# Patient Record
Sex: Male | Born: 1995 | Race: Black or African American | Hispanic: No | Marital: Single | Smoking: Never smoker
Health system: Southern US, Community
[De-identification: ages and names within clinical notes are randomized; demographics above are authoritative.]

---
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
## 2015-05-16 ENCOUNTER — Encounter (HOSPITAL_COMMUNITY): Payer: Self-pay | Admitting: Emergency Medicine

## 2015-05-16 ENCOUNTER — Emergency Department (HOSPITAL_COMMUNITY): Payer: Medicaid Other

## 2015-05-16 ENCOUNTER — Emergency Department (HOSPITAL_COMMUNITY)
Admission: EM | Admit: 2015-05-16 | Discharge: 2015-05-16 | Disposition: A | Discharge status: Home / Self Care | Payer: Medicaid Other | Attending: Emergency Medicine | Admitting: Emergency Medicine

## 2015-05-16 DIAGNOSIS — S0011XA Contusion of right eyelid and periocular area, initial encounter: Secondary | ICD-10-CM | POA: Insufficient documentation

## 2015-05-16 DIAGNOSIS — S022XXA Fracture of nasal bones, initial encounter for closed fracture: Secondary | ICD-10-CM

## 2015-05-16 DIAGNOSIS — Y92310 Basketball court as the place of occurrence of the external cause: Secondary | ICD-10-CM | POA: Insufficient documentation

## 2015-05-16 DIAGNOSIS — Y998 Other external cause status: Secondary | ICD-10-CM | POA: Diagnosis not present

## 2015-05-16 DIAGNOSIS — S02401A Maxillary fracture, unspecified, initial encounter for closed fracture: Secondary | ICD-10-CM

## 2015-05-16 DIAGNOSIS — W03XXXA Other fall on same level due to collision with another person, initial encounter: Secondary | ICD-10-CM | POA: Insufficient documentation

## 2015-05-16 DIAGNOSIS — Y9367 Activity, basketball: Secondary | ICD-10-CM | POA: Diagnosis not present

## 2015-05-16 DIAGNOSIS — S0993XA Unspecified injury of face, initial encounter: Secondary | ICD-10-CM | POA: Diagnosis present

## 2015-05-16 MED ORDER — AMOXICILLIN 500 MG PO CAPS: 500.0000 mg | ORAL_CAPSULE | Freq: Three times a day (TID) | ORAL | Status: AC

## 2015-05-16 NOTE — Discharge Instructions (Signed)
Nasal Fracture °A fracture is a break in a bone. A nasal fracture is a broken nose. Minor breaks do not need treatment. Serious breaks may need surgery. °HOME CARE °· If directed, put ice on the injured area: °¨ Put ice in a plastic bag. °¨ Place a towel between your skin and the bag. °¨ Leave the ice on for 20 minutes, 2-3 times per day. °· Take over-the-counter and prescription medicines only as told by your doctor. °· If your nose bleeds, sit up while you gently squeeze your nose shut for 10 minutes. °· Try to not blow your nose. °· Return to your normal activities as told by your doctor. Ask your doctor what activities are safe for you. °· Do not play contact sports for 3-4 weeks or as told by your doctor. °· Keep all follow-up visits as told by your doctor. This is important. °GET HELP IF: °· You have more pain or very bad pain. °· You keep having nosebleeds. °· The shape of your nose does not return to normal after 5 days. °· You have pus coming out of your nose. °GET HELP RIGHT AWAY IF: °· Your nose bleeds for more than 20 minutes. °· You have clear fluid draining out of your nose. °· You have a grape-like swelling on the inside of your nose. °· You have trouble moving your eyes. °· You keep throwing up (vomiting). °  °This information is not intended to replace advice given to you by your health care provider. Make sure you discuss any questions you have with your health care provider. °  °Document Released: 03/16/2008 Document Revised: 02/26/2015 Document Reviewed: 07/15/2014 °Elsevier Interactive Patient Education ©2016 [employer] ° °

## 2015-05-16 NOTE — ED Notes (Signed)
Pt states that on Wed he was playing basketball with his brother and they collided and now his nose is very swollen with bruising under right eye.

## 2015-05-19 NOTE — ED Provider Notes (Signed)
CSN: [id number]     Arrival date & time 05/16/15  1213 History   First MD Initiated Contact with Patient 05/16/15 1347     Chief Complaint  Patient presents with  . Facial Injury     (Consider location/radiation/quality/duration/timing/severity/associated sxs/prior Treatment) HPI   Jerry Garrett is a 19 y.o. male who presents to the Emergency Department complaining of pain and swelling of the nose.  Patient states that he was playing basketball and collided with his brother striking his nose.  Reports bleeding from both nostrils and immediate swelling and deformity of his nose.  Incident occurred several days ago.  He complains of continued pain and swelling with bruising under the right eye.  He has not taken any medications for symptom relief.  He denies vomiting, headaches, visual changes, continued nose bleeds or difficulty swallowing.     History reviewed. No pertinent past medical history. History reviewed. No pertinent past surgical history. History reviewed. No pertinent family history. Social History  Substance Use Topics  . Smoking status: Never Smoker   . Smokeless tobacco: None  . Alcohol Use: No    Review of Systems  Constitutional: Negative for fever, activity change and appetite change.  HENT: Positive for congestion, facial swelling and nosebleeds (swelling  and tenderness of the nose). Negative for ear pain and trouble swallowing.   Respiratory: Negative for shortness of breath.   Cardiovascular: Negative for chest pain.  Gastrointestinal: Negative for nausea and vomiting.  Neurological: Negative for dizziness, syncope, facial asymmetry, weakness and headaches.  Psychiatric/Behavioral: Negative for confusion.      Allergies  Review of patient's allergies indicates no known allergies.  Home Medications   Prior to Admission medications   Medication Sig Start Date End Date Taking? Authorizing Provider  amoxicillin (AMOXIL) 500 MG capsule Take 1 capsule  (500 mg total) by mouth 3 (three) times daily. For 10 days 05/16/15   Mandrell Vangilder, PA-C   BP 132/87 mmHg  Pulse 65  Temp(Src) 98.2 F (36.8 C) (Oral)  Resp 16  Ht 5\' 11"  (1.803 m)  Wt 68.04 kg  BMI 20.93 kg/m2  SpO2 100% Physical Exam  Constitutional: He is oriented to person, place, and time. He appears well-developed and well-nourished. No distress.  HENT:  Right Ear: Tympanic membrane and ear canal normal. No hemotympanum.  Left Ear: Tympanic membrane and ear canal normal. No hemotympanum.  Nose: Mucosal edema and nasal deformity present. No nasal septal hematoma. No epistaxis.  Mouth/Throat: Uvula is midline, oropharynx is clear and moist and mucous membranes are normal.  Dried blood to the right nostril.  ttp of the nose, moderate edema and ecchymosis just below the right eye.  No zygomatic tenderness  Eyes: Conjunctivae and EOM are normal. Pupils are equal, round, and reactive to light.  Neck: Normal range of motion. Neck supple.  Cardiovascular: Normal rate, regular rhythm and intact distal pulses.   Pulmonary/Chest: Effort normal and breath sounds normal. No respiratory distress.  Musculoskeletal: Normal range of motion.  Neurological: He is alert and oriented to person, place, and time. He exhibits normal muscle tone. Coordination normal.  Skin: Skin is warm and dry.  Nursing note and vitals reviewed.   ED Course  Procedures (including critical care time) Labs Review Labs Reviewed - No data to display  Imaging Review Dg Nasal Bones  05/16/2015  CLINICAL DATA:  Injury.  Initial evaluation. EXAM: NASAL BONES - 3+ VIEW COMPARISON:  CT 10/01/2012. FINDINGS: Comminuted scratched nasal fractures are present. Maxillary spine  intact. Paranasal sinuses are clear. IMPRESSION: Comminuted nasal fractures. Electronically Signed   By: Maisie Fus  Register   On: 05/16/2015 13:04   Ct Head Wo Contrast  05/16/2015  CLINICAL DATA:  Collision while playing basketball 2 days ago. Pain  and swelling below the right eye. Initial encounter. EXAM: CT HEAD WITHOUT CONTRAST CT MAXILLOFACIAL WITHOUT CONTRAST TECHNIQUE: Multidetector CT imaging of the head and maxillofacial structures were performed using the standard protocol without intravenous contrast. Multiplanar CT image reconstructions of the maxillofacial structures were also generated. COMPARISON:  Nasal bone radiographs from the same day. FINDINGS: CT HEAD FINDINGS No acute cortical infarct, hemorrhage, or mass lesion is present. The ventricles are of normal size. No significant extra-axial fluid collection is evident. The paranasal sinuses and mastoid air cells are clear. A right nasal fracture is only partially imaged. The calvarium is otherwise intact. Soft tissue swelling is noted about the nose. No other significant extracranial soft tissue injury is evident. CT MAXILLOFACIAL FINDINGS A comminuted right nasal bone fracture is present. There is a fracture in the anterior process of the right maxilla as well. The maxillary sinuses otherwise intact. The zygomatic arch is intact. The paranasal sinuses and mastoid air cells are clear. The mandible is intact and located. IMPRESSION: 1. Comminuted right set nasal bone fractures. 2. Normal CT appearance the brain. 3. No other significant facial fractures. Electronically Signed   By: Marin Roberts M.D.   On: 05/16/2015 15:25   Ct Maxillofacial Wo Cm  05/16/2015  CLINICAL DATA:  Collision while playing basketball 2 days ago. Pain and swelling below the right eye. Initial encounter. EXAM: CT HEAD WITHOUT CONTRAST CT MAXILLOFACIAL WITHOUT CONTRAST TECHNIQUE: Multidetector CT imaging of the head and maxillofacial structures were performed using the standard protocol without intravenous contrast. Multiplanar CT image reconstructions of the maxillofacial structures were also generated. COMPARISON:  Nasal bone radiographs from the same day. FINDINGS: CT HEAD FINDINGS No acute cortical infarct,  hemorrhage, or mass lesion is present. The ventricles are of normal size. No significant extra-axial fluid collection is evident. The paranasal sinuses and mastoid air cells are clear. A right nasal fracture is only partially imaged. The calvarium is otherwise intact. Soft tissue swelling is noted about the nose. No other significant extracranial soft tissue injury is evident. CT MAXILLOFACIAL FINDINGS A comminuted right nasal bone fracture is present. There is a fracture in the anterior process of the right maxilla as well. The maxillary sinuses otherwise intact. The zygomatic arch is intact. The paranasal sinuses and mastoid air cells are clear. The mandible is intact and located. IMPRESSION: 1. Comminuted right set nasal bone fractures. 2. Normal CT appearance the brain. 3. No other significant facial fractures. Electronically Signed   By: Marin Roberts M.D.   On: 05/16/2015 15:25    I have personally reviewed and evaluated these images and lab results as part of my medical decision-making.    MDM   Final diagnoses:  Nasal bone fracture, closed, initial encounter  Fracture of maxilla, closed, initial encounter   Pt is well appearing.  airwaay patent.  No active epistaxis.  Results discussed with pt and his mother.  He was given nasal fx precautions and agrees to close ENT f/u.  rx for amoxil    Pauline Aus, PA-C 05/19/15 2104  Bethann Berkshire, MD 05/27/15 2316

## 2016-09-16 IMAGING — CT CT HEAD W/O CM
3 of 5 series · 14 of 47 positions shown, 17 images · non-contrast
Comparison: Nasal bone radiographs from the same day.

CLINICAL DATA: Collision while playing basketball 2 days ago. Pain
and swelling below the right eye. Initial encounter.

EXAM:
CT HEAD WITHOUT CONTRAST
CT MAXILLOFACIAL WITHOUT CONTRAST
TECHNIQUE: Multidetector CT imaging of the head and maxillofacial structures
were performed using the standard protocol without intravenous
contrast. Multiplanar CT image reconstructions of the maxillofacial
structures were also generated.

[Series 4: max soft 2.0 h31s · axial · 0.37mm/px · z∈[-133,+32]mm · 9 of 134 slices shown, 12 images]
[im 12/134  brain]
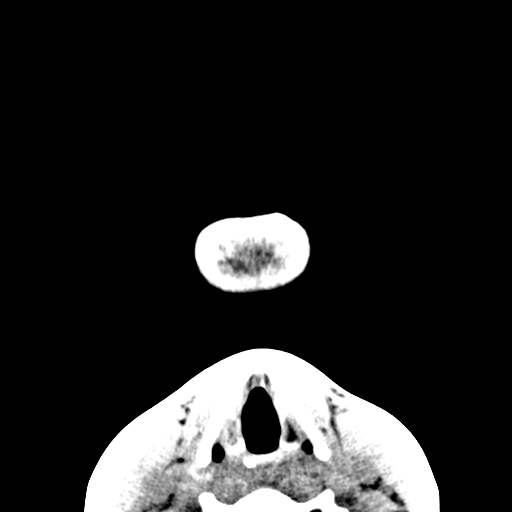
[im 12/134  bone]
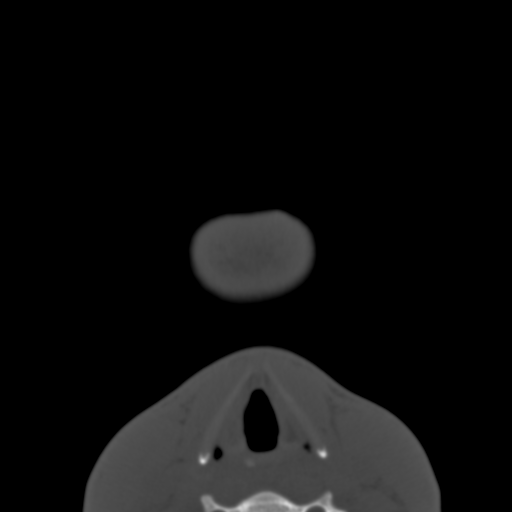
[im 24/134  brain]
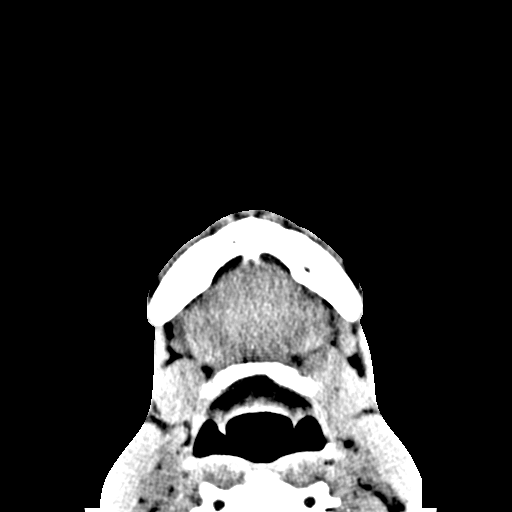
[im 41/134  brain]
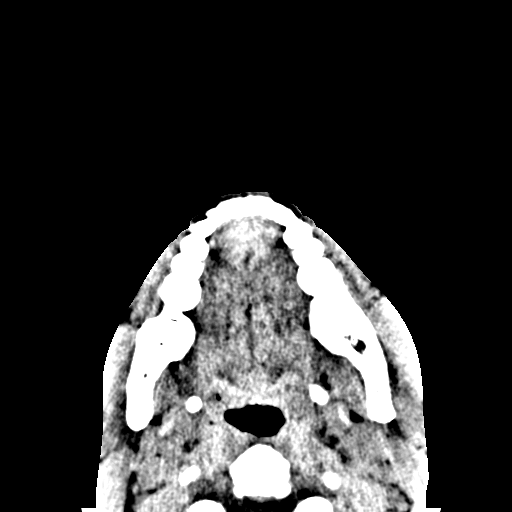
[im 53/134  brain]
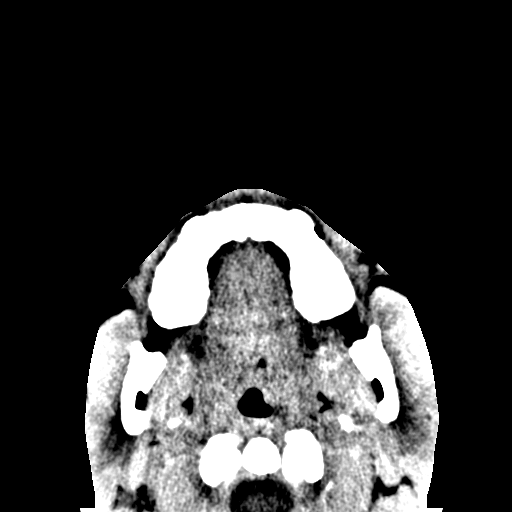
[im 70/134  brain]
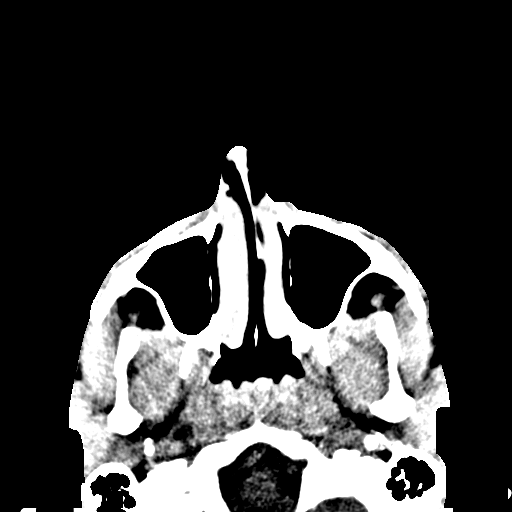
[im 70/134  bone]
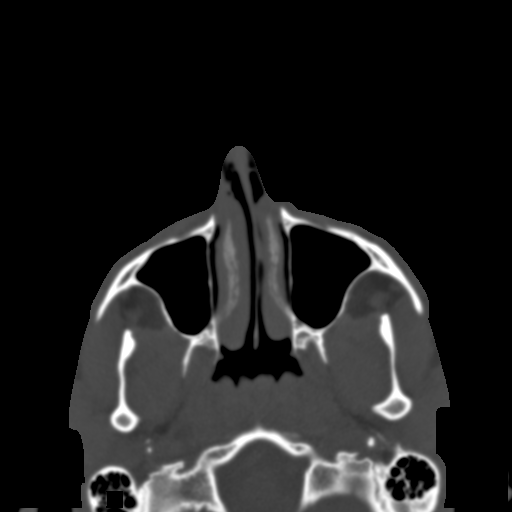
[im 81/134  brain]
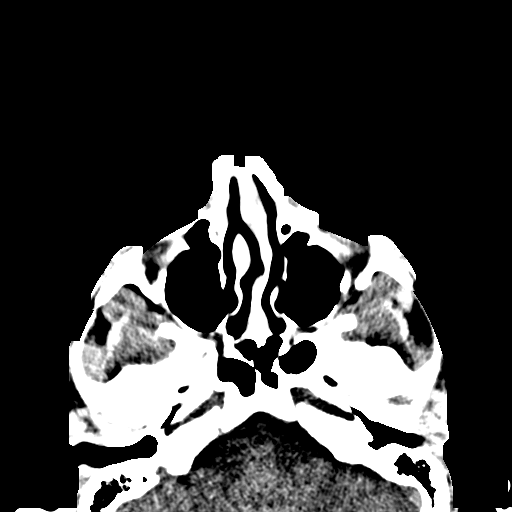
[im 93/134  brain]
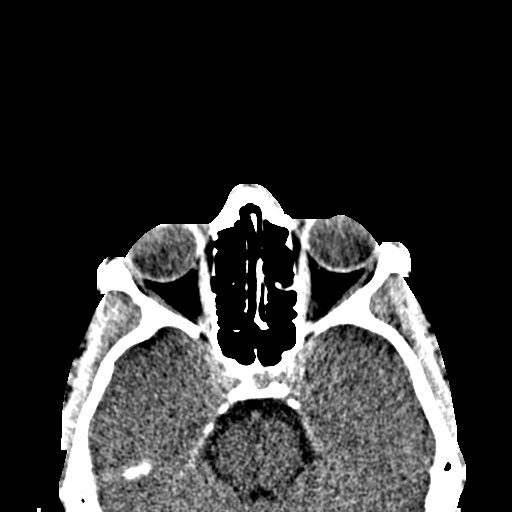
[im 110/134  brain]
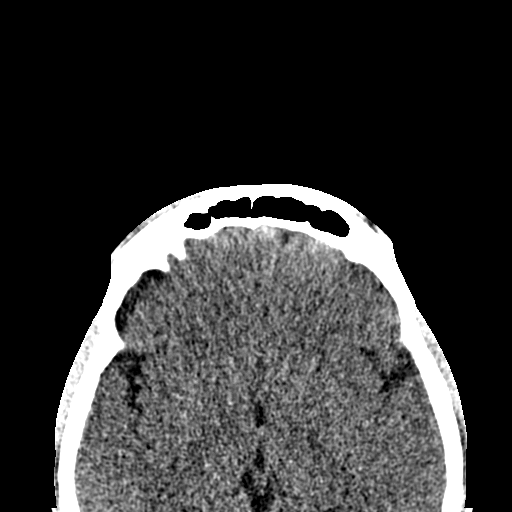
[im 122/134  brain]
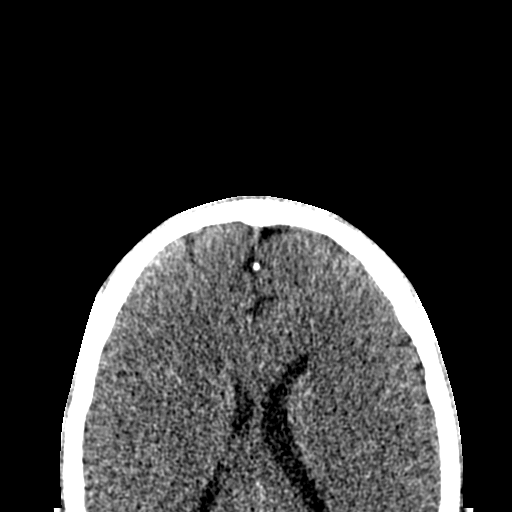
[im 122/134  bone]
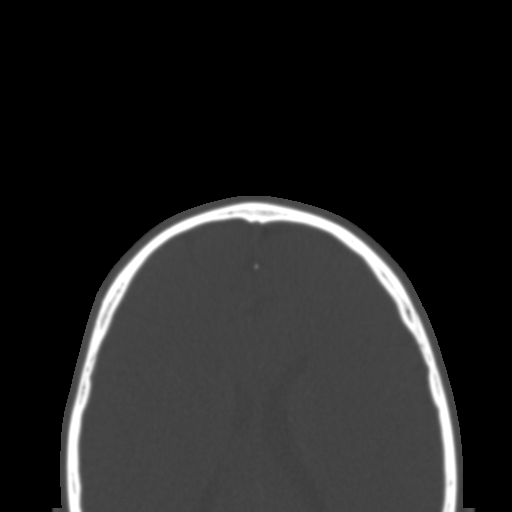

[Series 6: max st coronal · coronal · 0.30mm/px · 2 of 68 slices shown]
[im 23/68  brain]
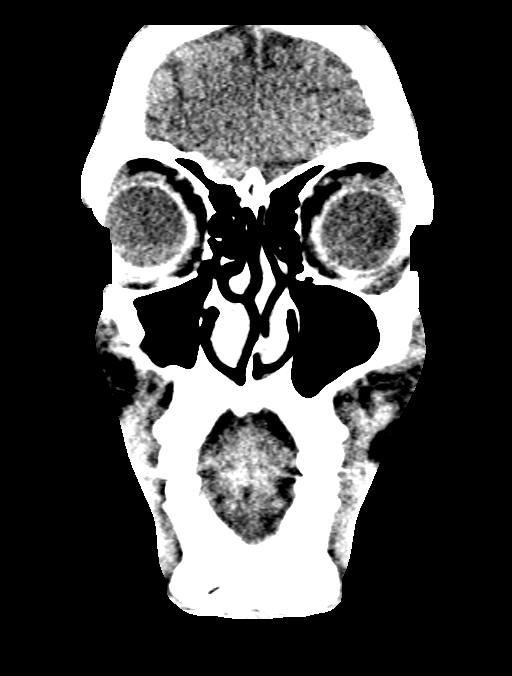
[im 45/68  brain]
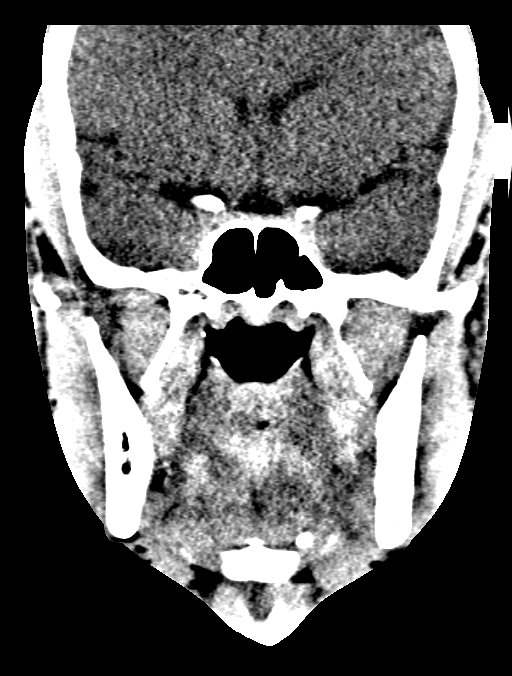

[Series 7: max st sagittal · sagittal · 0.30mm/px · 3 of 83 slices shown]
[im 28/83  brain]
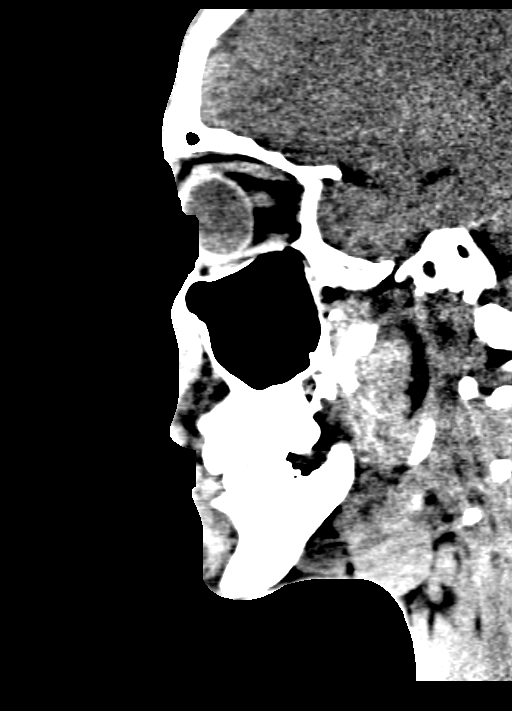
[im 42/83  brain]
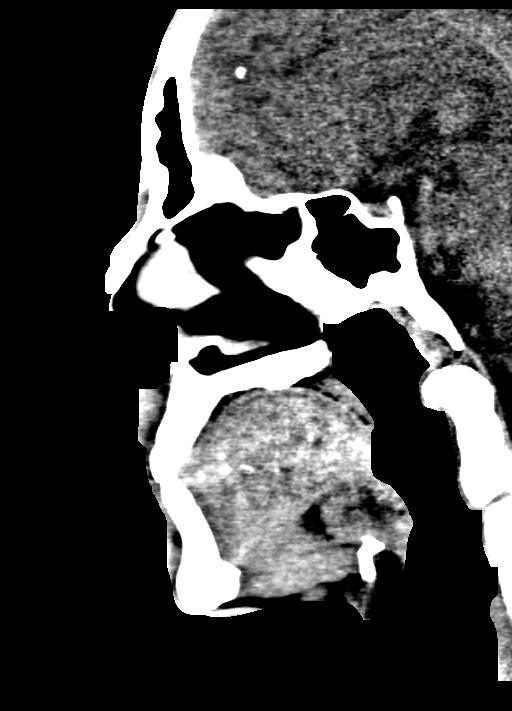
[im 55/83  brain]
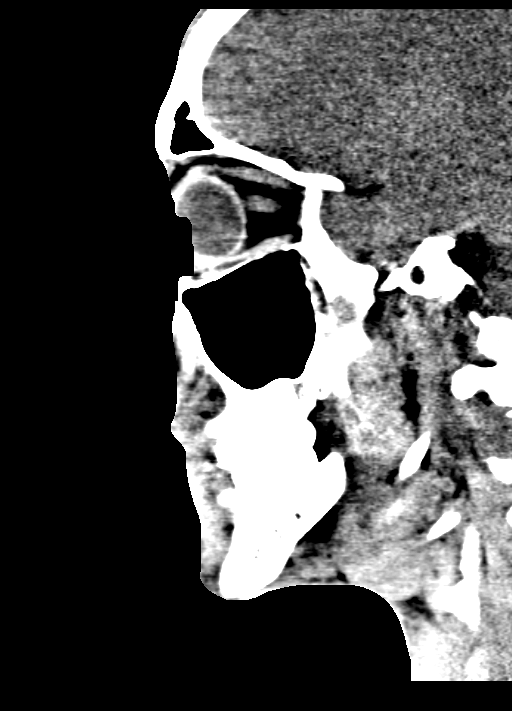

[14 of 47 positions shown; findings below may reference images not displayed]

FINDINGS: CT HEAD FINDINGS

No acute cortical infarct, hemorrhage, or mass lesion is present.
The ventricles are of normal size. No significant extra-axial fluid
collection is evident. The paranasal sinuses and mastoid air cells
are clear. A right nasal fracture is only partially imaged. The
calvarium is otherwise intact. Soft tissue swelling is noted about
the nose. No other significant extracranial soft tissue injury is
evident.

CT MAXILLOFACIAL FINDINGS

A comminuted right nasal bone fracture is present. There is a
fracture in the anterior process of the right maxilla as well. The
maxillary sinuses otherwise intact. The zygomatic arch is intact.
The paranasal sinuses and mastoid air cells are clear. The mandible
is intact and located.
IMPRESSION: 1. Comminuted right set nasal bone fractures.
2. Normal CT appearance the brain.
3. No other significant facial fractures.

## 2019-03-22 ENCOUNTER — Other Ambulatory Visit: Payer: Self-pay

## 2019-03-22 ENCOUNTER — Encounter (HOSPITAL_COMMUNITY): Payer: Self-pay | Admitting: Physical Therapy

## 2019-03-22 ENCOUNTER — Ambulatory Visit (HOSPITAL_COMMUNITY): Payer: 59 | Attending: Orthopedic Surgery | Admitting: Physical Therapy

## 2019-03-22 DIAGNOSIS — R262 Difficulty in walking, not elsewhere classified: Secondary | ICD-10-CM | POA: Insufficient documentation

## 2019-03-22 DIAGNOSIS — M25572 Pain in left ankle and joints of left foot: Secondary | ICD-10-CM | POA: Diagnosis not present

## 2019-03-22 NOTE — Patient Instructions (Signed)
Access Code: [email or web address]  URL: [email or web address]  Date: 03/22/2019  Prepared by: Yetta Glassman   Exercises Seated Calf Stretch with Strap - 3 reps - 1 sets - 30 hold - 1x daily - 7x weekly Toe Spreading - 10 reps - 15-20 hold - 5x daily - 7x weekly Seated Toe Flexion Extension PROM - 10 reps - 15-20 hold - 5x daily - 7x weekly Seated Toe Curl - 10 reps - 15-20 hold - 5x daily - 7x weekly Seated Self Great Toe Stretch - 10 reps - 15-20 hold - 5x daily - 7x weekly
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
## 2019-03-28 ENCOUNTER — Other Ambulatory Visit: Payer: Self-pay

## 2019-03-28 ENCOUNTER — Ambulatory Visit (HOSPITAL_COMMUNITY): Payer: 59 | Admitting: Physical Therapy

## 2019-03-28 ENCOUNTER — Encounter (HOSPITAL_COMMUNITY): Payer: Self-pay | Admitting: Physical Therapy

## 2019-03-28 DIAGNOSIS — M25572 Pain in left ankle and joints of left foot: Secondary | ICD-10-CM | POA: Diagnosis not present

## 2019-03-28 DIAGNOSIS — R262 Difficulty in walking, not elsewhere classified: Secondary | ICD-10-CM

## 2019-03-28 NOTE — Therapy (Signed)
[facility] [address] [place], [place], [address] Phone: [phone]   Fax:  [phone]  Physical Therapy Treatment  Patient Details  Name: Jerry Garrett MRN: [id number] Date of Birth: 1996-05-14 Referring Provider (PT): Arturo Morton   Encounter Date: 03/28/2019  PT End of Session - 03/28/19 1615    Visit Number  2    Number of Visits  4    Date for PT Re-Evaluation  04/12/19    Authorization Type  [employer] and [employer]    Authorization Time Period  03/22/19 to 04/12/19    Authorization - Visit Number  2    Authorization - Number of Visits  4    PT Start Time  1615    PT Stop Time  1700    PT Time Calculation (min)  45 min    Activity Tolerance  Patient tolerated treatment well;No increased pain    Behavior During Therapy  [facility] for tasks assessed/performed       History reviewed. No pertinent past medical history.  History reviewed. No pertinent surgical history.  There were no vitals filed for this visit.  Subjective Assessment - 03/28/19 1615    Subjective  no pain. slight difficulty with stretching exercises and would like to see quicker progress but understands it's going to be slow.    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  no difficulties    How long can you stand comfortably?  in his post op shoe - no difficulties.    How long can you walk comfortably?  in his post op shoe - no difficulties.    Patient Stated Goals  to have more mobility    Pain Onset  1 to 4 weeks ago         [facility] PT Assessment - 03/28/19 0001      Assessment   Medical Diagnosis  s/p left cheilectomy, synovectomy 1st metatarsophalangeal joint     Referring Provider (PT)  Jamelle Haring Daws    Onset Date/Surgical Date  02/28/19    Next MD Visit  3 weeks    Prior Therapy  no                   [facility] Adult PT Treatment/Exercise - 03/28/19  0001      Manual Therapy   Manual Therapy  Joint mobilization    Manual therapy comments  all manual therapy performed independently of other treatment interventions    Joint Mobilization  Hallux joint mobilitzation - distal and proximall joint grade III for mobility in both directions. metatarsal mobilization (1-3) grade III for mobility. TCJ grade III with PROM into DF. - tolerated wel      Ankle Exercises: Seated   Towel Crunch  Other (comment)   2x10 5" holds --> with inversion and eversion - 2x10 each              PT Short Term Goals - 03/28/19 1714      PT SHORT TERM GOAL #1   Title  Patient will be independent in HEP to improve functional outcomes.    Time  4    Period  Weeks    Status  On-going    Target Date  04/19/19      PT SHORT TERM GOAL #2   Title  Patient will demonstrate at least 30 degrees of left  first MTP into extension to demonstrate improved toe mobility.    Baseline  eval - 15 degrees    Time  4    Period  Weeks    Status  New    Target Date  04/19/19      PT SHORT TERM GOAL #3   Title  Patient will demonstrate at least 45 degrees of left first MTP into flexion to demonstrate improved toe mobility.    Baseline  eval 30 degrees    Time  4    Period  Weeks    Status  New    Target Date  04/19/19               Plan - 03/28/19 1709    Clinical Impression Statement  Focused on joint mobility. Improved distal hallux mobility noted. No pain during session. Educated patient on continued mobility work at home. Added towel scrunch exercise to HEP. Patietn would continue to benefit from skilled physical therapy focusing on improving post operative functional mobility.    Personal Factors and Comorbidities  Comorbidity 1    Comorbidities  hx of left foot bone fractures (unknown specifiic bones), chronic toe stiffness, low back pain    Examination-Activity Limitations  Squat;Stairs;Lift;Stand    Examination-Participation Restrictions  Yard  Work;Cleaning    Stability/Clinical Decision Making  Stable/Uncomplicated    Rehab Potential  Excellent    PT Frequency  1x / week    PT Duration  4 weeks    PT Treatment/Interventions  ADLs/Self Care Home Management;Aquatic Therapy;Biofeedback;Cryotherapy;Electrical Stimulation;Iontophoresis 4mg /ml Dexamethasone;Moist Heat;Traction;Gait training;Stair training;Functional mobility training;Therapeutic activities;Therapeutic exercise;Balance training;Neuromuscular re-education;Patient/family education;Manual techniques;Scar mobilization;Passive range of motion;Dry needling;Taping    PT Next Visit Plan  continued toe and foot mobility, ankle stretches, AROM and PROM; FOTO    PT Home Exercise Plan  eval: great toe mobility/stretches, calf stretch; 10/7 towel scrunch    Consulted and Agree with Plan of Care  Patient       Patient will benefit from skilled therapeutic intervention in order to improve the following deficits and impairments:  Pain, Decreased mobility, Decreased strength, Increased edema, Decreased balance, Hypomobility, Difficulty walking, Decreased range of motion  Visit Diagnosis: Difficulty in walking, not elsewhere classified  Pain in left ankle and joints of left foot     Problem List There are no active problems to display for this patient.  5:17 PM, 03/28/19 Jerene Pitch, DPT Physical Therapy with [facility]  [phone] office  [place] [address] [place], [place], [address] Phone: [phone]   Fax:  [phone]  Name: Jerry Garrett MRN: [id number] Date of Birth: [birth year]

## 2019-03-29 ENCOUNTER — Ambulatory Visit (HOSPITAL_COMMUNITY): Payer: 59 | Admitting: Physical Therapy

## 2019-04-05 ENCOUNTER — Other Ambulatory Visit: Payer: Self-pay

## 2019-04-05 ENCOUNTER — Ambulatory Visit (HOSPITAL_COMMUNITY): Payer: 59 | Admitting: Physical Therapy

## 2019-04-05 DIAGNOSIS — M25572 Pain in left ankle and joints of left foot: Secondary | ICD-10-CM

## 2019-04-05 DIAGNOSIS — R262 Difficulty in walking, not elsewhere classified: Secondary | ICD-10-CM

## 2019-04-05 NOTE — Therapy (Signed)
[place], [place], [address] Phone: [phone]   Fax:  [phone]  Physical Therapy Treatment  Patient Details  Name: Jerry Garrett MRN: [id number] Date of Birth: July 19, 1995 Referring Provider (PT): Catha Nottingham   Encounter Date: 04/05/2019  PT End of Session - 04/05/19 2376    Visit Number  3    Number of Visits  4    Date for PT Re-Evaluation  04/12/19    Authorization Type  [employer] and [employer]BS    Authorization Time Period  03/22/19 to 04/12/19    Authorization - Visit Number  3    Authorization - Number of Visits  4    PT Start Time  2831    PT Stop Time  1400    PT Time Calculation (min)  43 min    Activity Tolerance  Patient tolerated treatment well;No increased pain    Behavior During Therapy  [facility] for tasks assessed/performed       No past medical history on file.  No past surgical history on file.  There were no vitals filed for this visit.  Subjective Assessment - 04/05/19 1346    Subjective  states he is frustrated. He wishes he had more mobility. States that he has been doing his exercises and some of them are getting easier. No pain.    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  no difficulties    How long can you stand comfortably?  in his post op shoe - no difficulties.    How long can you walk comfortably?  in his post op shoe - no difficulties.    Patient Stated Goals  to have more mobility    Pain Score  0-No pain    Pain Onset  1 to 4 weeks ago         [facility] PT Assessment - 04/05/19 0001      Assessment   Medical Diagnosis  s/p left cheilectomy, synovectomy 1st metatarsophalangeal joint     Referring Provider (PT)  Emogene Morgan Daws    Onset Date/Surgical Date  02/28/19    Next MD Visit  3 weeks    Prior Therapy  no                   [facility] Adult PT Treatment/Exercise -  04/05/19 0001      Manual Therapy   Manual Therapy  Joint mobilization;Soft tissue mobilization    Manual therapy comments  all manual therapy performed independently of other treatment interventions    Joint Mobilization  Hallux joint mobilitzation - distal and proximall joint grade III for mobility in both directions. metatarsal mobilization (1-3) grade III for mobility. TCJ grade III with PROM into DF. - tolerated wel    Soft tissue mobilization  STM to Left interossei muscles and extensor hallucis longus      Ankle Exercises: Seated   Heel Raises  Left;15 reps;5 seconds   with overpressure at knee - eccentric lower   Heel Slides  Left;10 reps   10 SECOND HOLDS, focus on great toe stretch.              PT Short Term Goals - 03/28/19 1714      PT SHORT TERM GOAL #1   Title  Patient will be independent in HEP to improve functional outcomes.  Time  4    Period  Weeks    Status  On-going    Target Date  04/19/19      PT SHORT TERM GOAL #2   Title  Patient will demonstrate at least 30 degrees of left first MTP into extension to demonstrate improved toe mobility.    Baseline  eval - 15 degrees    Time  4    Period  Weeks    Status  New    Target Date  04/19/19      PT SHORT TERM GOAL #3   Title  Patient will demonstrate at least 45 degrees of left first MTP into flexion to demonstrate improved toe mobility.    Baseline  eval 30 degrees    Time  4    Period  Weeks    Status  New    Target Date  04/19/19               Plan - 04/05/19 1347    Clinical Impression Statement  tolerated session well. Continued stiffness noted in great toe. Focused on this and improvement noted in flexion and extension. Educated patietn in anticipated timeline with mobility due to chronicity of injury. Will continue to work on mobility as tolerated. Patient to follow up with MD next week.    Personal Factors and Comorbidities  Comorbidity 1    Comorbidities  hx of left foot bone  fractures (unknown specifiic bones), chronic toe stiffness, low back pain    Examination-Activity Limitations  Squat;Stairs;Lift;Stand    Examination-Participation Restrictions  Yard Work;Cleaning    Stability/Clinical Decision Making  Stable/Uncomplicated    Rehab Potential  Excellent    PT Frequency  1x / week    PT Duration  4 weeks    PT Treatment/Interventions  ADLs/Self Care Home Management;Aquatic Therapy;Biofeedback;Cryotherapy;Electrical Stimulation;Iontophoresis 4mg /ml Dexamethasone;Moist Heat;Traction;Gait training;Stair training;Functional mobility training;Therapeutic activities;Therapeutic exercise;Balance training;Neuromuscular re-education;Patient/family education;Manual techniques;Scar mobilization;Passive range of motion;Dry needling;Taping    PT Next Visit Plan  continued toe and foot mobility, ankle stretches, AROM and PROM; FOTO    PT Home Exercise Plan  eval: great toe mobility/stretches, calf stretch; 10/7 towel scrunch; 10/15 heel raises with over pressure    Consulted and Agree with Plan of Care  Patient       Patient will benefit from skilled therapeutic intervention in order to improve the following deficits and impairments:  Pain, Decreased mobility, Decreased strength, Increased edema, Decreased balance, Hypomobility, Difficulty walking, Decreased range of motion  Visit Diagnosis: Difficulty in walking, not elsewhere classified  Pain in left ankle and joints of left foot     Problem List There are no active problems to display for this patient.  2:54 PM, 04/05/19 04/07/19, DPT Physical Therapy with [facility]  [phone] office  [facility] [address] [place], [place], [place] Phone: [phone]   Fax:  [phone]  Name: Jerry Garrett MRN: Sharla Kidney Date of Birth: [birth year]

## 2019-04-12 ENCOUNTER — Ambulatory Visit (HOSPITAL_COMMUNITY): Payer: 59 | Admitting: Physical Therapy

## 2019-04-12 ENCOUNTER — Other Ambulatory Visit: Payer: Self-pay

## 2019-04-12 ENCOUNTER — Encounter (HOSPITAL_COMMUNITY): Payer: Self-pay | Admitting: Physical Therapy

## 2019-04-12 DIAGNOSIS — M25572 Pain in left ankle and joints of left foot: Secondary | ICD-10-CM

## 2019-04-12 DIAGNOSIS — R262 Difficulty in walking, not elsewhere classified: Secondary | ICD-10-CM

## 2019-04-12 NOTE — Patient Instructions (Signed)

## 2019-04-12 NOTE — Therapy (Signed)
[facility] [address] [place], [place], [address] Phone: [phone]   Fax:  [phone]  Physical Therapy Treatment and Recert/Progress Note  Patient Details  Name: Jerry Garrett MRN: [id number] Date of Birth: [birth year] Referring Provider (PT): Jamelle Haring Daws  Progress Note Reporting Period 03/22/19  to 04/12/19  See note below for Objective Data and Assessment of Progress/Goals.        Encounter Date: 04/12/2019  PT End of Session - 04/12/19 1509    Visit Number  4    Number of Visits  10    Authorization Type  Aetna and [employer]BS    Authorization Time Period  03/22/19 to 04/12/19 NEW POC DATES 04/12/19 to 05/24/19    Authorization - Visit Number  4    Authorization - Number of Visits  4    PT Start Time  1402    PT Stop Time  1445    PT Time Calculation (min)  43 min    Activity Tolerance  Patient tolerated treatment well;No increased pain    Behavior During Therapy  [facility] for tasks assessed/performed       History reviewed. No pertinent past medical history.  History reviewed. No pertinent surgical history.  There were no vitals filed for this visit.  Subjective Assessment - 04/12/19 1506    Subjective  states he missed his appointment with Dr. Lelon Mast. His mother was suppose to watch the kids for him and she had a kidney stone and couldn't watch them. 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  no difficulties    How long can you stand comfortably?  in his post op shoe - no difficulties.    How long can you walk comfortably?  in his post op shoe - no difficulties.    Patient Stated Goals  to have more mobility    Currently in Pain?  No/denies    Pain Onset  1 to 4 weeks ago         [facility] PT Assessment - 04/12/19 0001      Assessment   Medical Diagnosis  s/p left cheilectomy,  synovectomy 1st metatarsophalangeal joint     Referring Provider (PT)  Jamelle Haring Daws    Onset Date/Surgical Date  02/28/19    Next MD Visit  3 weeks    Prior Therapy  no      ROM / Strength   AROM / PROM / Strength  AROM;PROM      PROM   Overall PROM Comments  great hallux: flexion L 30, R 60; extension L 15, R 60    Right Ankle Dorsiflexion  5    Right Ankle Plantar Flexion  55    Left Ankle Dorsiflexion  5    Left Ankle Plantar Flexion  60                   [facility] Adult PT Treatment/Exercise - 04/12/19 0001      Manual Therapy   Manual Therapy  Joint mobilization;Soft tissue mobilization;Manual Traction    Manual therapy comments  all manual therapy performed independently of other treatment interventions    Joint Mobilization  Hallux joint mobilitzation - distal and proximall joint grade III for mobility in both directions. metatarsal mobilization (1-3) grade III for mobility. TCJ grade III with PROM into  DF.    Soft tissue mobilization  STM to interossei and plantar fascia on left - tolerated moderately well    Manual Traction  to left hallux, tolerated well - continuous      Ankle Exercises: Stretches   Other Stretch  PT overpressur stretch of left hallux into flexion and extension x15, 10 " holds    Other Stretch  seated hallux stretch in seating - 4x20 holds L       Trigger Point Dry Needling - 04/12/19 0001    Consent Given?  Yes    Education Handout Provided  Yes    Muscles Treated Lower Quadrant  --   left 1st interossei of foot and flexor hallucis longus L   Dry Needling Comments  improved mobility into great toe extension noted afterwards, 4 53mm needles at .25 width used - kept in situ 10 minutes with occassional twisting.            PT Education - 04/12/19 1507    Education Details  educated patient on risks of dry needling and contraindications of dry needling. Answered all questions about intervention    Person(s) Educated  Patient    Methods   Explanation;Handout    Comprehension  Verbalized understanding       PT Short Term Goals - 04/12/19 1542      PT SHORT TERM GOAL #1   Title  Patient will be independent in HEP to improve functional outcomes.    Time  4    Period  Weeks    Status  Achieved    Target Date  04/19/19      PT SHORT TERM GOAL #2   Title  Patient will demonstrate at least 30 degrees of left first MTP into extension to demonstrate improved toe mobility.    Baseline  eval - 15 degrees    Time  4    Period  Weeks    Status  On-going    Target Date  04/19/19      PT SHORT TERM GOAL #3   Title  Patient will demonstrate at least 45 degrees of left first MTP into flexion to demonstrate improved toe mobility.    Baseline  10/22 30 degrees    Time  4    Period  Weeks    Status  On-going    Target Date  04/19/19        PT Long Term Goals - 04/12/19 1542      PT LONG TERM GOAL #1   Title  Patient will be able to ambulate at least 226 feet with minimal gait deviations in regular shoe (per MD protocol) to demonstrate improved ambulation.    Baseline  currently not able - per MD protocol    Time  10    Period  Weeks    Status  New    Target Date  05/24/19      PT LONG TERM GOAL #2   Title  Patient will be able to stand for at least 60 minutes in work shoes without pain in his left foot to improve ability to stand for work.    Baseline  currently not able - per MD protocol    Time  6    Period  Weeks    Status  New    Target Date  05/24/19      PT LONG TERM GOAL #3   Title  Patient will be able to perform 20 SL heel raises on both  legs to demonstrate improved calf strength.    Baseline  currently not able - per MD protocol    Time  6    Period  Weeks    Status  New    Target Date  05/24/19      PT LONG TERM GOAL #4   Title  Patient will be able to stand on left leg for at least 30 seconds on blue foam to improve static balance.    Baseline  currently not able - per MD protocol             Plan - 04/12/19 1510    Clinical Impression Statement  Patient still in heel weight bearing shoe today as he missed his appointment this week. Scheduled to see MD next week prior to next therapy appointment. Anticipate MD to discontinue use of heel weight bearing shoe so long as everything is healing well. Extending POC additional 6 weeks to continue to work on joint mobility and transition to weight bearing activities in regular shoes (as permitted by MD). Continued stiffness in left hallux joint, added dry needling today and this was tolerated well. Improved mobility noted after dry needling. Patient would continue to benefit from skilled physical therapy focusing on improving functional mobility.    Personal Factors and Comorbidities  Comorbidity 1    Comorbidities  hx of left foot bone fractures (unknown specifiic bones), chronic toe stiffness, low back pain    Examination-Activity Limitations  Squat;Stairs;Lift;Stand    Examination-Participation Restrictions  Yard Work;Cleaning    Stability/Clinical Decision Making  Stable/Uncomplicated    Rehab Potential  Excellent    PT Frequency  1x / week    PT Duration  6 weeks    PT Treatment/Interventions  ADLs/Self Care Home Management;Aquatic Therapy;Biofeedback;Cryotherapy;Electrical Stimulation;Iontophoresis 4mg /ml Dexamethasone;Moist Heat;Traction;Gait training;Stair training;Functional mobility training;Therapeutic activities;Therapeutic exercise;Balance training;Neuromuscular re-education;Patient/family education;Manual techniques;Scar mobilization;Passive range of motion;Dry needling;Taping    PT Next Visit Plan  continued toe and foot mobility, ankle stretches, AROM and PROM    PT Home Exercise Plan  eval: great toe mobility/stretches, calf stretch; 10/7 towel scrunch; 10/15 heel raises with over pressure    Consulted and Agree with Plan of Care  Patient       Patient will benefit from skilled therapeutic intervention in order to  improve the following deficits and impairments:  Pain, Decreased mobility, Decreased strength, Increased edema, Decreased balance, Hypomobility, Difficulty walking, Decreased range of motion  Visit Diagnosis: Difficulty in walking, not elsewhere classified  Pain in left ankle and joints of left foot     Problem List There are no active problems to display for this patient.  4:17 PM, 04/12/19 Tereasa CoopMichele Kamauri Kathol, DPT [facility]  [phone] office  [facility] [address] [place], [place], [address] Phone: [phone]   Fax:  [phone]  Name: Jerry Garrett MRN: [id number] Date of Birth: July 23, 1995

## 2019-04-19 ENCOUNTER — Other Ambulatory Visit: Payer: Self-pay

## 2019-04-19 ENCOUNTER — Ambulatory Visit (HOSPITAL_COMMUNITY): Payer: 59 | Admitting: Physical Therapy

## 2019-04-19 ENCOUNTER — Encounter (HOSPITAL_COMMUNITY): Payer: Self-pay | Admitting: Physical Therapy

## 2019-04-19 DIAGNOSIS — M25572 Pain in left ankle and joints of left foot: Secondary | ICD-10-CM | POA: Diagnosis not present

## 2019-04-19 NOTE — Therapy (Signed)
[facility] [place], [place], [address] Phone: [phone]   Fax:  [phone]  Physical Therapy Treatment  Patient Details  Name: Jerry Garrett MRN: [id number] Date of Birth: 07/22/1995 Referring Provider (PT): Emogene Morgan Daws   Encounter Date: 04/19/2019  PT End of Session - 04/19/19 1559    Visit Number  5    Number of Visits  10    Authorization Type  Aetna and [employer]BS    Authorization Time Period  03/22/19 to 04/12/19 NEW POC DATES 04/12/19 to 05/24/19    Authorization - Visit Number  1    Authorization - Number of Visits  10    PT Start Time  0160    PT Stop Time  1616    PT Time Calculation (min)  43 min    Activity Tolerance  Patient tolerated treatment well;No increased pain    Behavior During Therapy  [facility] for tasks assessed/performed       History reviewed. No pertinent past medical history.  History reviewed. No pertinent surgical history.  There were no vitals filed for this visit.  Subjective Assessment - 04/19/19 1537    Subjective  States he saw the MD and she wants him to stay out of work for another 6 weeks, he can return on light duty. States he is meeting with HR next week. Reports that he is no longer in the walking shoe and can wear regular tennis shoes. Reports no pain since removing the show. States MD said everything looked good, no running or jumping for 6 weeks.    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  no difficulties    How long can you stand comfortably?  in his post op shoe - no difficulties.    How long can you walk comfortably?  in his post op shoe - no difficulties.    Patient Stated Goals  to have more mobility    Currently in Pain?  No/denies    Pain Onset  1 to 4 weeks ago         [facility] PT Assessment - 04/19/19 0001      Assessment   Medical Diagnosis  s/p left cheilectomy,  synovectomy 1st metatarsophalangeal joint     Referring Provider (PT)  Emogene Morgan Daws    Onset Date/Surgical Date  02/28/19    Next MD Visit  3 weeks    Prior Therapy  no      ROM / Strength   AROM / PROM / Strength  Strength      Strength   Strength Assessment Site  Ankle    Right/Left Ankle  Right;Left    Right Ankle Plantar Flexion  5/5    Left Ankle Plantar Flexion  3+/5   4 heel raises -pain     Balance   Balance Assessed  Yes      Static Standing Balance   Static Standing - Balance Support  No upper extremity supported    Static Standing Balance -  Activities   Single Leg Stance - Right Leg;Single Leg Stance - Left Leg;Tandam Stance - Right Leg;Tandam Stance - Left Leg   on floor and on towel   Static Standing - Comment/# of Minutes  left side more sway at ankle (and with left ankle posterior   tandem and SLS on floor and on foam 30"each  [facility] Adult PT Treatment/Exercise - 04/19/19 0001      Ankle Exercises: Standing   SLS  2x30" floor and foam bilateral    Heel Raises  Both;15 reps;2 seconds   eccentric lower, performed with knee bent and straight   Toe Raise  15 reps;5 seconds   BOTH - POSTERIOR SUPPORT   Other Standing Ankle Exercises  sit to stands - to chair 0 4x5, slow and controlled, hip hinges with anterior weight shift x10, 15" holds     Other Standing Ankle Exercises  tandem stance on floor and on foam 2x30" B and each. tandem with head nods vertical and lateral 2x5 each and bilateral               PT Short Term Goals - 04/12/19 1542      PT SHORT TERM GOAL #1   Title  Patient will be independent in HEP to improve functional outcomes.    Time  4    Period  Weeks    Status  Achieved    Target Date  04/19/19      PT SHORT TERM GOAL #2   Title  Patient will demonstrate at least 30 degrees of left first MTP into extension to demonstrate improved toe mobility.    Baseline  eval - 15 degrees    Time  4    Period  Weeks     Status  On-going    Target Date  04/19/19      PT SHORT TERM GOAL #3   Title  Patient will demonstrate at least 45 degrees of left first MTP into flexion to demonstrate improved toe mobility.    Baseline  10/22 30 degrees    Time  4    Period  Weeks    Status  On-going    Target Date  04/19/19        PT Long Term Goals - 04/12/19 1542      PT LONG TERM GOAL #1   Title  Patient will be able to ambulate at least 226 feet with minimal gait deviations in regular shoe (per MD protocol) to demonstrate improved ambulation.    Baseline  currently not able - per MD protocol    Time  10    Period  Weeks    Status  New    Target Date  05/24/19      PT LONG TERM GOAL #2   Title  Patient will be able to stand for at least 60 minutes in work shoes without pain in his left foot to improve ability to stand for work.    Baseline  currently not able - per MD protocol    Time  6    Period  Weeks    Status  New    Target Date  05/24/19      PT LONG TERM GOAL #3   Title  Patient will be able to perform 20 SL heel raises on both legs to demonstrate improved calf strength.    Baseline  currently not able - per MD protocol    Time  6    Period  Weeks    Status  New    Target Date  05/24/19      PT LONG TERM GOAL #4   Title  Patient will be able to stand on left leg for at least 30 seconds on blue foam to improve static balance.    Baseline  currently not able - per MD protocol  Plan - 04/19/19 1559    Clinical Impression Statement  Patietn tolerated session well. Focused on building up HEP secondary to MD lifting WB restrictions (no longer requiring heel weight bearing shoe.) Patient tolerated exercises well but did was fatigued. Will continue to progress weightbearing exercise and initiate low back stretches (extension) secondary to limitations noted with exercises. Patient would continue to benefit from skilled physical therapy.    Personal Factors and Comorbidities   Comorbidity 1    Comorbidities  hx of left foot bone fractures (unknown specifiic bones), chronic toe stiffness, low back pain    Examination-Activity Limitations  Squat;Stairs;Lift;Stand    Examination-Participation Restrictions  Yard Work;Cleaning    Stability/Clinical Decision Making  Stable/Uncomplicated    Rehab Potential  Excellent    PT Frequency  1x / week    PT Duration  6 weeks    PT Treatment/Interventions  ADLs/Self Care Home Management;Aquatic Therapy;Biofeedback;Cryotherapy;Electrical Stimulation;Iontophoresis 4mg /ml Dexamethasone;Moist Heat;Traction;Gait training;Stair training;Functional mobility training;Therapeutic activities;Therapeutic exercise;Balance training;Neuromuscular re-education;Patient/family education;Manual techniques;Scar mobilization;Passive range of motion;Dry needling;Taping    PT Next Visit Plan  lumbar extension stretches, balance, standing exercises. continued toe and foot mobility, ankle stretches, AROM and PROM    PT Home Exercise Plan  eval: great toe mobility/stretches, calf stretch; 10/7 towel scrunch; 10/15 heel raises with over pressure    Consulted and Agree with Plan of Care  Patient       Patient will benefit from skilled therapeutic intervention in order to improve the following deficits and impairments:  Pain, Decreased mobility, Decreased strength, Increased edema, Decreased balance, Hypomobility, Difficulty walking, Decreased range of motion  Visit Diagnosis: Difficulty in walking, not elsewhere classified  Pain in left ankle and joints of left foot     Problem List There are no active problems to display for this patient.   5:10 PM, 04/19/19 Tereasa CoopMichele Santana Edell, DPT Physical Therapy with [facility]  [phone] office  [facility] [address] [place], [place], [address] Phone: [phone]   Fax:  [phone]  Name: Jerry Garrett MRN: [id number] Date of Birth:  12-Jun-1996

## 2019-04-25 ENCOUNTER — Telehealth (HOSPITAL_COMMUNITY): Payer: Self-pay | Admitting: Physical Therapy

## 2019-04-25 ENCOUNTER — Encounter (HOSPITAL_COMMUNITY): Payer: Self-pay | Admitting: Physical Therapy

## 2019-04-25 NOTE — Telephone Encounter (Signed)
No show #1 Called patient and left message about missed appointment and about next appointment. Instructed patient to call and cancel if he cannot make his next appointment.   2:16 PM, 04/25/19 Jerene Pitch, DPT Physical Therapy with [facility]  [phone] office

## 2019-05-03 ENCOUNTER — Encounter (HOSPITAL_COMMUNITY): Payer: Self-pay | Admitting: Physical Therapy

## 2019-05-03 ENCOUNTER — Other Ambulatory Visit: Payer: Self-pay

## 2019-05-03 ENCOUNTER — Ambulatory Visit (HOSPITAL_COMMUNITY): Payer: 59 | Attending: Orthopedic Surgery | Admitting: Physical Therapy

## 2019-05-03 DIAGNOSIS — R262 Difficulty in walking, not elsewhere classified: Secondary | ICD-10-CM | POA: Diagnosis present

## 2019-05-03 DIAGNOSIS — M25572 Pain in left ankle and joints of left foot: Secondary | ICD-10-CM | POA: Insufficient documentation

## 2019-05-03 NOTE — Therapy (Signed)
[facility] [address] [place], [place], [address] Phone: [phone]   Fax:  [phone]  Physical Therapy Treatment  Patient Details  Name: Jerry Garrett MRN: [id number] Date of Birth: 04-Feb-1996 Referring Provider (PT): Jamelle Haring Daws   Encounter Date: 05/03/2019  PT End of Session - 05/03/19 1047    Visit Number  6    Number of Visits  10    Authorization Type  Aetna and BCBS    Authorization Time Period  03/22/19 to 04/12/19 NEW POC DATES 04/12/19 to 05/24/19    Authorization - Visit Number  2    Authorization - Number of Visits  10    PT Start Time  1047    PT Stop Time  1130    PT Time Calculation (min)  43 min    Activity Tolerance  Patient tolerated treatment well;No increased pain    Behavior During Therapy  [facility] for tasks assessed/performed       History reviewed. No pertinent past medical history.  History reviewed. No pertinent surgical history.  There were no vitals filed for this visit.  Subjective Assessment - 05/03/19 1305    Subjective  States he overslept  last time which is why he missed his apt. States he has been doing his exercises, but there are a few of them. States he has been walking a lot and he has had minor pain and swelling especially at the end of tthe day.    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  no difficulties    How long can you stand comfortably?  in his post op shoe - no difficulties.    How long can you walk comfortably?  in his post op shoe - no difficulties.    Patient Stated Goals  to have more mobility    Currently in Pain?  No/denies    Pain Onset  1 to 4 weeks ago         [facility] PT Assessment - 05/03/19 0001      Assessment   Medical Diagnosis  s/p left cheilectomy, synovectomy 1st metatarsophalangeal joint     Referring Provider (PT)  Jamelle Haring Daws    Onset Date/Surgical Date   02/28/19    Next MD Visit  3 weeks    Prior Therapy  no                   [facility] Adult PT Treatment/Exercise - 05/03/19 0001      Manual Therapy   Manual Therapy  Joint mobilization;Soft tissue mobilization;Manual Traction    Manual therapy comments  all manual therapy performed independently of other treatment interventions    Joint Mobilization  Hallux joint mobilitzation - distal and proximall joint grade III for mobility in both directions. metatarsal mobilization (1-3) grade III for mobility.     Soft tissue mobilization  STM to interossei and plantar fascia on left - tolerated moderately well    Manual Traction  to left hallux, tolerated well - continuous      Ankle Exercises: Standing   Other Standing Ankle Exercises  lunge ups x15 B - left to available ROM      Ankle Exercises: Stretches   Other Stretch  cat/dog in quadruped x10, 5" holds Each       Ankle Exercises: Supine   Other Supine Ankle Exercises  plank  rock forward and backwards 2x10, 5" holds in each position             PT Education - 05/03/19 1307    Education Details  on use of compression socks, decreasing amount of walking if pain increases or persists into the next day, about focusing on 30-45 minutes of exercises daily.    Person(s) Educated  Patient    Methods  Explanation    Comprehension  Verbalized understanding       PT Short Term Goals - 04/12/19 1542      PT SHORT TERM GOAL #1   Title  Patient will be independent in HEP to improve functional outcomes.    Time  4    Period  Weeks    Status  Achieved    Target Date  04/19/19      PT SHORT TERM GOAL #2   Title  Patient will demonstrate at least 30 degrees of left first MTP into extension to demonstrate improved toe mobility.    Baseline  eval - 15 degrees    Time  4    Period  Weeks    Status  On-going    Target Date  04/19/19      PT SHORT TERM GOAL #3   Title  Patient will demonstrate at least 45 degrees of left first  MTP into flexion to demonstrate improved toe mobility.    Baseline  10/22 30 degrees    Time  4    Period  Weeks    Status  On-going    Target Date  04/19/19        PT Long Term Goals - 04/12/19 1542      PT LONG TERM GOAL #1   Title  Patient will be able to ambulate at least 226 feet with minimal gait deviations in regular shoe (per MD protocol) to demonstrate improved ambulation.    Baseline  currently not able - per MD protocol    Time  10    Period  Weeks    Status  New    Target Date  05/24/19      PT LONG TERM GOAL #2   Title  Patient will be able to stand for at least 60 minutes in work shoes without pain in his left foot to improve ability to stand for work.    Baseline  currently not able - per MD protocol    Time  6    Period  Weeks    Status  New    Target Date  05/24/19      PT LONG TERM GOAL #3   Title  Patient will be able to perform 20 SL heel raises on both legs to demonstrate improved calf strength.    Baseline  currently not able - per MD protocol    Time  6    Period  Weeks    Status  New    Target Date  05/24/19      PT LONG TERM GOAL #4   Title  Patient will be able to stand on left leg for at least 30 seconds on blue foam to improve static balance.    Baseline  currently not able - per MD protocol            Plan - 05/03/19 1050    Clinical Impression Statement  Patient tolerated session well. Focused on toe mobility with exercises and mobilizations. Improved movement noted but still very limited and restricted in  great toe extension. Instructed patient on wearing compression socks and decreasing walking if pain persists afterwards. Patient doing well, would continue to benefit from skilled physical therapy focusing on improving ROM and functional mobility.    Personal Factors and Comorbidities  Comorbidity 1    Comorbidities  hx of left foot bone fractures (unknown specifiic bones), chronic toe stiffness, low back pain    Examination-Activity  Limitations  Squat;Stairs;Lift;Stand    Examination-Participation Restrictions  Yard Work;Cleaning    Stability/Clinical Decision Making  Stable/Uncomplicated    Rehab Potential  Excellent    PT Frequency  1x / week    PT Duration  6 weeks    PT Treatment/Interventions  ADLs/Self Care Home Management;Aquatic Therapy;Biofeedback;Cryotherapy;Electrical Stimulation;Iontophoresis 4mg /ml Dexamethasone;Moist Heat;Traction;Gait training;Stair training;Functional mobility training;Therapeutic activities;Therapeutic exercise;Balance training;Neuromuscular re-education;Patient/family education;Manual techniques;Scar mobilization;Passive range of motion;Dry needling;Taping    PT Next Visit Plan  balance, standing exercises. continued toe and foot mobility, ankle stretches, AROM and PROM    PT Home Exercise Plan  eval: great toe mobility/stretches, calf stretch; 10/7 towel scrunch; 10/15 heel raises with over pressure    Consulted and Agree with Plan of Care  Patient       Patient will benefit from skilled therapeutic intervention in order to improve the following deficits and impairments:  Pain, Decreased mobility, Decreased strength, Increased edema, Decreased balance, Hypomobility, Difficulty walking, Decreased range of motion  Visit Diagnosis: Difficulty in walking, not elsewhere classified  Pain in left ankle and joints of left foot     Problem List There are no active problems to display for this patient.   3:49 PM, 05/03/19 Jerene Pitch, DPT Physical Therapy with [facility]  [phone] office  [facility] [address] [place], [place], [address] Phone: [phone]   Fax:  [phone]  Name: Jerry Garrett MRN: [id number] Date of Birth: 03-17-1996

## 2019-05-10 ENCOUNTER — Encounter (HOSPITAL_COMMUNITY): Payer: Self-pay | Admitting: Physical Therapy

## 2019-05-10 ENCOUNTER — Other Ambulatory Visit: Payer: Self-pay

## 2019-05-10 ENCOUNTER — Encounter (HOSPITAL_COMMUNITY): Payer: Self-pay

## 2019-05-10 ENCOUNTER — Ambulatory Visit (HOSPITAL_COMMUNITY): Payer: 59

## 2019-05-10 DIAGNOSIS — R262 Difficulty in walking, not elsewhere classified: Secondary | ICD-10-CM

## 2019-05-10 DIAGNOSIS — M25572 Pain in left ankle and joints of left foot: Secondary | ICD-10-CM

## 2019-05-10 NOTE — Therapy (Signed)
[name] [address] [place] [place], [place], [address] Phone: [phone]   Fax:  [phone]  Physical Therapy Treatment  Patient Details  Name: Jerry KidneyJoshua A Garrett MRN: [id number] Date of Birth: Dec 18, 1995 Referring Provider (PT): Jamelle HaringSnow Daws   Encounter Date: 05/10/2019  PT End of Session - 05/10/19 1012    Visit Number  7    Number of Visits  10    Date for PT Re-Evaluation  05/24/19    Authorization Type  Aetna and BCBS    Authorization Time Period  03/22/19 to 04/12/19 NEW POC DATES 04/12/19 to 05/24/19    Authorization - Visit Number  3    Authorization - Number of Visits  10    PT Start Time  1008    PT Stop Time  1051    PT Time Calculation (min)  43 min    Activity Tolerance  Patient tolerated treatment well;No increased pain    Behavior During Therapy  [facility] for tasks assessed/performed       History reviewed. No pertinent past medical history.  History reviewed. No pertinent surgical history.  There were no vitals filed for this visit.  Subjective Assessment - 05/10/19 1011    Subjective  Pt stated he is feeling good today, no reports of pain just tightness this morning.    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  No/denies         [facility] Assessment - 05/10/19 0001      Assessment   Medical Diagnosis  s/p left cheilectomy, synovectomy 1st metatarsophalangeal joint     Referring Provider (PT)  Jamelle HaringSnow Daws    Onset Date/Surgical Date  02/28/19    Next MD Visit  1208/20    Prior Therapy  no                   [facility] Adult PT Treatment/Exercise - 05/10/19 0001      Exercises   Exercises  Ankle      Manual Therapy   Manual Therapy  Joint mobilization;Myofascial release;Soft tissue mobilization    Manual therapy comments  all manual therapy performed independently of other treatment interventions    Soft tissue mobilization  STM to  interossei and plantar fascia on left - tolerated moderately well    Myofascial Release  Scar tissue massage    Manual Traction  to left hallux, tolerated well - continuous      Ankle Exercises: Standing   SLS  60" BLE    Heel Raises  Both;20 reps;3 seconds    Toe Raise  15 reps;5 seconds   Great toe extensoin   Other Standing Ankle Exercises  squats 15x    Other Standing Ankle Exercises  lunge ups x15  B - left to available ROM      Ankle Exercises: Supine   Other Supine Ankle Exercises  plank rock forward and backwards 2x10, 5" holds in each position      Ankle Exercises: Stretches   Other Stretch  cat/dog in quadruped x10, 5" holds Each                PT Short Term Goals - 04/12/19 1542      PT SHORT TERM GOAL #1   Title  Patient will be independent in HEP to improve functional outcomes.    Time  4  Period  Weeks    Status  Achieved    Target Date  04/19/19      PT SHORT TERM GOAL #2   Title  Patient will demonstrate at least 30 degrees of left first MTP into extension to demonstrate improved toe mobility.    Baseline  eval - 15 degrees    Time  4    Period  Weeks    Status  On-going    Target Date  04/19/19      PT SHORT TERM GOAL #3   Title  Patient will demonstrate at least 45 degrees of left first MTP into flexion to demonstrate improved toe mobility.    Baseline  10/22 30 degrees    Time  4    Period  Weeks    Status  On-going    Target Date  04/19/19        PT Long Term Goals - 04/12/19 1542      PT LONG TERM GOAL #1   Title  Patient will be able to ambulate at least 226 feet with minimal gait deviations in regular shoe (per MD protocol) to demonstrate improved ambulation.    Baseline  currently not able - per MD protocol    Time  10    Period  Weeks    Status  New    Target Date  05/24/19      PT LONG TERM GOAL #2   Title  Patient will be able to stand for at least 60 minutes in work shoes without pain in his left foot to improve ability  to stand for work.    Baseline  currently not able - per MD protocol    Time  6    Period  Weeks    Status  New    Target Date  05/24/19      PT LONG TERM GOAL #3   Title  Patient will be able to perform 20 SL heel raises on both legs to demonstrate improved calf strength.    Baseline  currently not able - per MD protocol    Time  6    Period  Weeks    Status  New    Target Date  05/24/19      PT LONG TERM GOAL #4   Title  Patient will be able to stand on left leg for at least 30 seconds on blue foam to improve static balance.    Baseline  currently not able - per MD protocol            Plan - 05/10/19 1105    Clinical Impression Statement  Pt tolerated well to session.  Session focus with toe mobility especially great toe extension.  Continued with core/balance exercises for stability, stretches and ankle strengthening exercise.  Min cueing to improve form with lunges and planks with focus on toe mobility.  EOS with manual traction/PROM, scar tissue myofascial release and STM to address tightness.  Improved movement noted but still very limited and restricted in great toe extension.No reports of pain through session.    Personal Factors and Comorbidities  Comorbidity 1    Comorbidities  hx of left foot bone fractures (unknown specifiic bones), chronic toe stiffness, low back pain    Examination-Activity Limitations  Squat;Stairs;Lift;Stand    Examination-Participation Restrictions  Yard Work;Cleaning    Stability/Clinical Decision Making  Stable/Uncomplicated    Clinical Decision Making  Low    Rehab Potential  Excellent    PT  Frequency  1x / week    PT Duration  6 weeks    PT Treatment/Interventions  ADLs/Self Care Home Management;Aquatic Therapy;Biofeedback;Cryotherapy;Electrical Stimulation;Iontophoresis 4mg /ml Dexamethasone;Moist Heat;Traction;Gait training;Stair training;Functional mobility training;Therapeutic activities;Therapeutic exercise;Balance training;Neuromuscular  re-education;Patient/family education;Manual techniques;Scar mobilization;Passive range of motion;Dry needling;Taping    PT Next Visit Plan  balance, standing exercises. continued toe and foot mobility, ankle stretches, AROM and PROM    PT Home Exercise Plan  eval: great toe mobility/stretches, calf stretch; 10/7 towel scrunch; 10/15 heel raises with over pressure       Patient will benefit from skilled therapeutic intervention in order to improve the following deficits and impairments:  Pain, Decreased mobility, Decreased strength, Increased edema, Decreased balance, Hypomobility, Difficulty walking, Decreased range of motion  Visit Diagnosis: Pain in left ankle and joints of left foot  Difficulty in walking, not elsewhere classified     Problem List There are no active problems to display for this patient.  [address] LPTA; CBIS [phone] Aldona Lento 05/10/2019, 11:12 AM  [place], [place], [address] Phone: [phone]   Fax:  [phone]  Name: EUSEBIO BLAZEJEWSKI MRN: [id number] Date of Birth: [birth year]

## 2019-05-16 ENCOUNTER — Other Ambulatory Visit: Payer: Self-pay

## 2019-05-16 ENCOUNTER — Encounter (HOSPITAL_COMMUNITY): Payer: Self-pay | Admitting: Physical Therapy

## 2019-05-16 ENCOUNTER — Ambulatory Visit (HOSPITAL_COMMUNITY): Payer: 59 | Admitting: Physical Therapy

## 2019-05-16 DIAGNOSIS — M25572 Pain in left ankle and joints of left foot: Secondary | ICD-10-CM

## 2019-05-16 DIAGNOSIS — R262 Difficulty in walking, not elsewhere classified: Secondary | ICD-10-CM | POA: Diagnosis not present

## 2019-05-16 NOTE — Therapy (Signed)
[place], [place], [address] Phone: [phone]   Fax:  [phone]  Physical Therapy Treatment  Patient Details  Name: Jerry Garrett MRN: [id number] Date of Birth: Jun 16, 1996 Referring Provider (PT): Emogene Morgan Daws   Encounter Date: 05/16/2019  PT End of Session - 05/16/19 0914    Visit Number  8    Number of Visits  10    Date for PT Re-Evaluation  05/24/19    Authorization Type  [employer] and [employer]    Authorization Time Period  03/22/19 to 04/12/19 NEW POC DATES 04/12/19 to 05/24/19    Authorization - Visit Number  4    Authorization - Number of Visits  10    PT Start Time  0915    PT Stop Time  1000    PT Time Calculation (min)  45 min    Activity Tolerance  Patient tolerated treatment well;No increased pain    Behavior During Therapy  [facility] for tasks assessed/performed       History reviewed. No pertinent past medical history.  History reviewed. No pertinent surgical history.  There were no vitals filed for this visit.  Subjective Assessment - 05/16/19 0914    Subjective  At the end of working on it he has a dull achy pain but it doesn't last long. No sharp pain currently just stiff. 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  No/denies         [facility] PT Assessment - 05/16/19 0001      Assessment   Medical Diagnosis  s/p left cheilectomy, synovectomy 1st metatarsophalangeal joint     Referring Provider (PT)  Emogene Morgan Daws    Onset Date/Surgical Date  02/28/19    Next MD Visit  05/29/19                   [facility] Adult PT Treatment/Exercise - 05/16/19 0001      Manual Therapy   Manual Therapy  Joint mobilization;Soft tissue mobilization    Manual therapy comments  all manual therapy performed independently of other treatment interventions    Joint  Mobilization  Hallux joint mobilitzation - distal and proximall joint grade III for mobility in both directions. metatarsal mobilization (1-3) grade III for mobility.     Soft tissue mobilization  STM to interossei and plantar fascia on left - tolerated moderately well      Ankle Exercises: Stretches   Other Stretch  kneeling stretch for ankle and tibial translation - 3x10 B- 5" holds      Ankle Exercises: Standing   Other Standing Ankle Exercises  warriror I with forward reach 2x5,10" holds B; semi single leg pistol squats x5, B       Trigger Point Dry Needling - 05/16/19 0001    Consent Given?  Yes    Education Handout Provided  Yes    Muscles Treated Lower Quadrant  --   1st interossei and flexor hallucis L    Dry Needling Comments  cavitation noted after in left great toe- 3 needles used              PT Short Term Goals - 04/12/19 1542      PT SHORT TERM GOAL #1   Title  Patient will be independent  in HEP to improve functional outcomes.    Time  4    Period  Weeks    Status  Achieved    Target Date  04/19/19      PT SHORT TERM GOAL #2   Title  Patient will demonstrate at least 30 degrees of left first MTP into extension to demonstrate improved toe mobility.    Baseline  eval - 15 degrees    Time  4    Period  Weeks    Status  On-going    Target Date  04/19/19      PT SHORT TERM GOAL #3   Title  Patient will demonstrate at least 45 degrees of left first MTP into flexion to demonstrate improved toe mobility.    Baseline  10/22 30 degrees    Time  4    Period  Weeks    Status  On-going    Target Date  04/19/19        PT Long Term Goals - 04/12/19 1542      PT LONG TERM GOAL #1   Title  Patient will be able to ambulate at least 226 feet with minimal gait deviations in regular shoe (per MD protocol) to demonstrate improved ambulation.    Baseline  currently not able - per MD protocol    Time  10    Period  Weeks    Status  New    Target Date  05/24/19       PT LONG TERM GOAL #2   Title  Patient will be able to stand for at least 60 minutes in work shoes without pain in his left foot to improve ability to stand for work.    Baseline  currently not able - per MD protocol    Time  6    Period  Weeks    Status  New    Target Date  05/24/19      PT LONG TERM GOAL #3   Title  Patient will be able to perform 20 SL heel raises on both legs to demonstrate improved calf strength.    Baseline  currently not able - per MD protocol    Time  6    Period  Weeks    Status  New    Target Date  05/24/19      PT LONG TERM GOAL #4   Title  Patient will be able to stand on left leg for at least 30 seconds on blue foam to improve static balance.    Baseline  currently not able - per MD protocol            Plan - 05/16/19 0915    Clinical Impression Statement  Tolerated session well today. Continued limitations noted in left great toe but notable change noted in great toe flexion. Added additional strengthening and mobility exercises without pain. Patient enjoyed pistols and excited to work towards improving functional strength and mobility in entire lower leg. Will continue to work on improving ROM and overall function in left lower leg and foot.    Personal Factors and Comorbidities  Comorbidity 1    Comorbidities  hx of left foot bone fractures (unknown specifiic bones), chronic toe stiffness, low back pain    Examination-Activity Limitations  Squat;Stairs;Lift;Stand    Examination-Participation Restrictions  Yard Work;Cleaning    Stability/Clinical Decision Making  Stable/Uncomplicated    Rehab Potential  Excellent    PT Frequency  1x / week  PT Duration  6 weeks    PT Treatment/Interventions  ADLs/Self Care Home Management;Aquatic Therapy;Biofeedback;Cryotherapy;Electrical Stimulation;Iontophoresis 4mg /ml Dexamethasone;Moist Heat;Traction;Gait training;Stair training;Functional mobility training;Therapeutic activities;Therapeutic  exercise;Balance training;Neuromuscular re-education;Patient/family education;Manual techniques;Scar mobilization;Passive range of motion;Dry needling;Taping    PT Next Visit Plan  f/u with pistol progression, balance, standing exercises. continued toe and foot mobility, ankle stretches, AROM and PROM    PT Home Exercise Plan  eval: great toe mobility/stretches, calf stretch; 10/7 towel scrunch; 10/15 heel raises with over pressure       Patient will benefit from skilled therapeutic intervention in order to improve the following deficits and impairments:  Pain, Decreased mobility, Decreased strength, Increased edema, Decreased balance, Hypomobility, Difficulty walking, Decreased range of motion  Visit Diagnosis: Pain in left ankle and joints of left foot  Difficulty in walking, not elsewhere classified     Problem List There are no active problems to display for this patient.   10:01 AM, 05/16/19 Tereasa CoopMichele Jomar Denz, DPT Physical [facility]  [phone] office  [facility] [address] [place], [place], [address] Phone: [phone]   Fax:  [phone]  Name: Sharla KidneyJoshua A Rohrer MRN: [id number] Date of Birth: March 07, 1996

## 2019-05-24 ENCOUNTER — Encounter (HOSPITAL_COMMUNITY): Payer: Self-pay | Admitting: Physical Therapy

## 2019-06-01 ENCOUNTER — Other Ambulatory Visit: Payer: Self-pay

## 2019-06-01 ENCOUNTER — Ambulatory Visit (HOSPITAL_COMMUNITY): Payer: Managed Care, Other (non HMO) | Attending: Orthopedic Surgery | Admitting: Physical Therapy

## 2019-06-01 DIAGNOSIS — M25572 Pain in left ankle and joints of left foot: Secondary | ICD-10-CM | POA: Diagnosis not present

## 2019-06-01 DIAGNOSIS — R262 Difficulty in walking, not elsewhere classified: Secondary | ICD-10-CM | POA: Diagnosis present

## 2019-06-01 NOTE — Therapy (Signed)
[place], [place], [address] Phone: [phone]   Fax:  [phone]  Physical Therapy Treatment and Recert  Patient Details  Name: Jerry Garrett MRN: [id number] Date of Birth: 23-Jan-1996 Referring Provider (PT): Emogene Morgan Daws  Progress Note Reporting Period 04/12/19 to 06/01/19  See note below for Objective Data and Assessment of Progress/Goals.       Encounter Date: 06/01/2019  PT End of Session - 06/01/19 1049    Visit Number  10    Number of Visits  16    Date for PT Re-Evaluation  07/13/19    Authorization Type  [employer] and [employer]    Authorization Time Period  03/22/19 to 04/12/19, 04/12/19 to 05/24/19 NEW POC DATES 06/01/19 to 07/13/2019    Authorization - Visit Number  1    Authorization - Number of Visits  10    PT Start Time  4496    PT Stop Time  1129    PT Time Calculation (min)  40 min    Activity Tolerance  Patient tolerated treatment well;No increased pain    Behavior During Therapy  [facility] for tasks assessed/performed       No past medical history on file.  No past surgical history on file.  There were no vitals filed for this visit.  Subjective Assessment - 06/01/19 1049    Subjective  States he went back to work on Wednesday. states 4 hours into shift he started having pain (has to wear steel toed shoes and walking on concrete floor. Discomfort noted at the end of the day. States he takes ibuprofen, states he has some minor redness no swelling. States that any of the exercises where he has to bend his toe he starts having the pain he had when he first having surgery. Pain is right in the joint, 4.5/10 at worse.    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  No/denies         [facility] PT Assessment - 06/01/19 0001      Assessment   Medical Diagnosis  s/p left cheilectomy, synovectomy 1st metatarsophalangeal joint     Referring Provider (PT)  Emogene Morgan Daws    Onset Date/Surgical Date  02/28/19      PROM   Overall PROM Comments  great hallux: flexion L 25, R 60; extension L 15, R 60      Strength   Right Ankle Plantar Flexion  5/5    Left Ankle Plantar Flexion  4/5      Static Standing Balance   Static Standing - Comment/# of Minutes  left side more sway at ankle (and with left ankle posterior  tandem and SLS on floor and on foam 30"each                   [facility] Adult PT Treatment/Exercise - 06/01/19 0001      Manual Therapy   Manual Therapy  Joint mobilization;Soft tissue mobilization    Manual therapy comments  all manual therapy performed independently of other treatment interventions    Joint Mobilization  Hallux joint mobilitzation - distal and proximall joint grade III for mobility in both directions. metatarsal mobilization (1-3) grade III for mobility.     Soft tissue mobilization  STM to interossei and plantar fascia on left - tolerated well    Manual Traction  to left  hallux, tolerated well - continuous             PT Education - 06/01/19 1459    Education Details  in use of inserts (gel or memory foam) to decrease force on foot with prolonged standing at work. At anticipated ROM gains, at how strengthening/ mobilty work can help with pain. With compounding pain and to monitor symptoms, days worked in sequence, onset of symptoms. What to do if pain returns - ROM, ice, elevation    Person(s) Educated  Patient    Methods  Explanation    Comprehension  Verbalized understanding       PT Short Term Goals - 06/01/19 1316      PT SHORT TERM GOAL #1   Title  Patient will be independent in HEP to improve functional outcomes.    Time  4    Period  Weeks    Status  Achieved    Target Date  04/19/19      PT SHORT TERM GOAL #2   Title  Patient will demonstrate at least 30 degrees of left first MTP into extension to demonstrate improved toe mobility.    Baseline  see objective  measurements    Time  4    Period  Weeks    Status  On-going    Target Date  04/19/19      PT SHORT TERM GOAL #3   Title  Patient will demonstrate at least 45 degrees of left first MTP into flexion to demonstrate improved toe mobility.    Baseline  see objective measurements    Time  4    Period  Weeks    Status  On-going    Target Date  04/19/19        PT Long Term Goals - 06/01/19 1353      PT LONG TERM GOAL #1   Title  Patient will be able to ambulate at least 226 feet with minimal gait deviations in regular shoe (per MD protocol) to demonstrate improved ambulation.    Baseline  MET    Time  10    Period  Weeks    Status  Achieved      PT LONG TERM GOAL #2   Title  Patient will be able to stand for at least 60 minutes in work shoes without pain in his left foot to improve ability to stand for work.    Baseline  MET - able to stand 4 hours without pain    Time  6    Period  Weeks    Status  Achieved      PT LONG TERM GOAL #3   Title  Patient will be able to perform 20 SL heel raises on both legs to demonstrate improved calf strength.    Baseline  06/01/19 15 each    Time  6    Period  Weeks    Status  On-going      PT LONG TERM GOAL #4   Title  Patient will be able to stand on left leg for at least 30 seconds on blue foam to improve static balance.    Baseline  06/01/19 MET    Status  Achieved      PT LONG TERM GOAL #5   Title  Patient will be able to stand for at least 6 hours in work shoes without pain in his left foot to improve ability to stand for work.    Baseline  12/11 4 hours  Status  New    Target Date  07/13/19            Plan - 06/01/19 1342    Clinical Impression Statement  Patient present for a progress note. Patient was doing well with no reports of significant pain but has recently returned to work on Wednesday. With return to work, patient has return of pain symptoms that are very similar to pain right after surgery. Thoroughly educated  patient on not letting pain compound and to monitor onset of symptoms as well as allow for rest days in between work days (instead of picking up extra shifts) to allow for symptoms to return to baseline. Patient verbalized understanding. Patient tolerated session well. Patient would continue to benefit from skilled physical therapy with recent increase in pain with return to work to improve pain management and standing tolerance and decrease risk of reinjury and need for additional surgery. Extending POC additional 6 weeks at 1x/week to work on these goals.    Personal Factors and Comorbidities  Comorbidity 1    Comorbidities  hx of left foot bone fractures (unknown specifiic bones), chronic toe stiffness, low back pain    Examination-Activity Limitations  Squat;Stairs;Lift;Stand    Examination-Participation Restrictions  Yard Work;Cleaning    Stability/Clinical Decision Making  Stable/Uncomplicated    Rehab Potential  Excellent    PT Frequency  1x / week    PT Duration  6 weeks    PT Treatment/Interventions  ADLs/Self Care Home Management;Aquatic Therapy;Biofeedback;Cryotherapy;Electrical Stimulation;Iontophoresis 53m/ml Dexamethasone;Moist Heat;Traction;Gait training;Stair training;Functional mobility training;Therapeutic activities;Therapeutic exercise;Balance training;Neuromuscular re-education;Patient/family education;Manual techniques;Scar mobilization;Passive range of motion;Dry needling;Taping    PT Next Visit Plan  f/u with pain management, balance, standing exercises. continued toe and foot mobility, ankle stretches, AROM and PROM    PT Home Exercise Plan  eval: great toe mobility/stretches, calf stretch; 10/7 towel scrunch; 10/15 heel raises with over pressure       Patient will benefit from skilled therapeutic intervention in order to improve the following deficits and impairments:  Pain, Decreased mobility, Decreased strength, Increased edema, Decreased balance, Hypomobility, Difficulty  walking, Decreased range of motion  Visit Diagnosis: Pain in left ankle and joints of left foot  Difficulty in walking, not elsewhere classified     Problem List There are no problems to display for this patient.   MEliezer Champagne12/04/2019, 3:06 PM  [place] [address]Phone: [phone]  Fax:  [phone] Name: JMERVIN RAMIRESMRN: [id number] of Birth: 112/10/1995

## 2019-06-06 ENCOUNTER — Ambulatory Visit (HOSPITAL_COMMUNITY): Payer: Managed Care, Other (non HMO) | Admitting: Physical Therapy

## 2019-06-13 ENCOUNTER — Ambulatory Visit (HOSPITAL_COMMUNITY): Payer: Managed Care, Other (non HMO) | Admitting: Physical Therapy

## 2019-06-20 ENCOUNTER — Telehealth (HOSPITAL_COMMUNITY): Payer: Self-pay | Admitting: Physical Therapy

## 2019-06-20 ENCOUNTER — Ambulatory Visit (HOSPITAL_COMMUNITY): Payer: Managed Care, Other (non HMO) | Admitting: Physical Therapy

## 2019-06-20 NOTE — Telephone Encounter (Signed)
pt had to work overtime.

## 2019-06-27 ENCOUNTER — Ambulatory Visit (HOSPITAL_COMMUNITY): Payer: BC Managed Care – PPO | Attending: Orthopedic Surgery | Admitting: Physical Therapy

## 2019-06-27 ENCOUNTER — Other Ambulatory Visit: Payer: Self-pay

## 2019-06-27 DIAGNOSIS — M25572 Pain in left ankle and joints of left foot: Secondary | ICD-10-CM | POA: Diagnosis present

## 2019-06-27 DIAGNOSIS — R262 Difficulty in walking, not elsewhere classified: Secondary | ICD-10-CM

## 2019-06-27 NOTE — Therapy (Signed)
[place], [place], [address] Phone: [phone]   Fax:  [phone]  Physical Therapy Treatment and Discharge  Patient Details  Name: Jerry Garrett MRN: [id number] Date of Birth: [birth year] Referring Provider (PT): Emogene Morgan Daws  PHYSICAL THERAPY DISCHARGE SUMMARY  Visits from Start of Care: 11  Current functional level related to goals / functional outcomes: See goals below   Remaining deficits: Toe mobility and LE strength    Education / Equipment: On HEP, progression, see education section  Plan: Patient agrees to discharge.  Patient goals were partially met. Patient is being discharged due to being pleased with the current functional level.  ?????       Encounter Date: 06/27/2019  PT End of Session - 06/27/19 1233    Visit Number  11    Number of Visits  16    Date for PT Re-Evaluation  07/13/19    Authorization Type  [employer]na and [employer]    Authorization Time Period  03/22/19 to 04/12/19, 04/12/19 to 05/24/19 NEW POC DATES 06/01/19 to 07/13/2019    Authorization - Visit Number  2    Authorization - Number of Visits  10    PT Start Time  1017    PT Stop Time  1058    PT Time Calculation (min)  41 min    Activity Tolerance  Patient tolerated treatment well;No increased pain    Behavior During Therapy  [facility] for tasks assessed/performed       No past medical history on file.  No past surgical history on file.  There were no vitals filed for this visit.  Subjective Assessment - 06/27/19 1123    Subjective  Patient reports he has been working 12 hour shifts and over time with no real pain, just some discomfort at the end of the day. States he got inserts and those seem to help, he also got a toe spacer and it positions his toe great but it feels not right when he walks with it. States that he returned to the gym and he has noticed some slight discomfort and pain with leg press at 110 pounds. Patient is working  on his mobility. Still notes redness and swelling at the end of the day but this improves with elevation. He wears his compression stockings.    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  No/denies         [facility] PT Assessment - 06/27/19 0001      Assessment   Medical Diagnosis  s/p left cheilectomy, synovectomy 1st metatarsophalangeal joint     Referring Provider (PT)  Emogene Morgan Daws    Onset Date/Surgical Date  02/28/19                   [facility] Adult PT Treatment/Exercise - 06/27/19 0001      Ankle Exercises: Machines for Strengthening   Cybex Leg Press  Double leg and single leg leg press - started with 2 plates- progressed to 5 plate - 5 plates was painful with single leg leg press after 15 reps       Ankle Exercises: Standing   Tai Chi  modified pistol squat on step - used 18 inch box - bilateral x3 - to comfortable depth.              PT Education - 06/27/19 1128  Education Details  on decreasing spacer size. On decreasing intensity of exercise (reps/sets/weight) as needed to keep exercise painfree. On single leg versus double leg exercises and compensations. On not running or jumping at this time until leg pressing his body weight on one leg is painfree. on return to running program if appropriate. On gradual return to recreational activiites and how to monitor symptoms.    Person(s) Educated  Patient    Methods  Explanation    Comprehension  Verbalized understanding       PT Short Term Goals - 06/01/19 1316      PT SHORT TERM GOAL #1   Title  Patient will be independent in HEP to improve functional outcomes.    Time  4    Period  Weeks    Status  Achieved    Target Date  04/19/19      PT SHORT TERM GOAL #2   Title  Patient will demonstrate at least 30 degrees of left first MTP into extension to demonstrate improved toe mobility.    Baseline  see objective measurements    Time  4     Period  Weeks    Status  On-going    Target Date  04/19/19      PT SHORT TERM GOAL #3   Title  Patient will demonstrate at least 45 degrees of left first MTP into flexion to demonstrate improved toe mobility.    Baseline  see objective measurements    Time  4    Period  Weeks    Status  On-going    Target Date  04/19/19        PT Long Term Goals - 06/27/19 1250      PT LONG TERM GOAL #1   Title  Patient will be able to ambulate at least 226 feet with minimal gait deviations in regular shoe (per MD protocol) to demonstrate improved ambulation.    Baseline  MET    Time  10    Period  Weeks    Status  Achieved      PT LONG TERM GOAL #2   Title  Patient will be able to stand for at least 60 minutes in work shoes without pain in his left foot to improve ability to stand for work.    Baseline  MET - able to stand 4 hours without pain    Time  6    Period  Weeks    Status  Achieved      PT LONG TERM GOAL #3   Title  Patient will be able to perform 20 SL heel raises on both legs to demonstrate improved calf strength.    Baseline  06/01/19 15 each    Time  6    Period  Weeks    Status  On-going      PT LONG TERM GOAL #4   Title  Patient will be able to stand on left leg for at least 30 seconds on blue foam to improve static balance.    Baseline  06/01/19 MET    Status  Achieved      PT LONG TERM GOAL #5   Title  Patient will be able to stand for at least 6 hours in work shoes without pain in his left foot to improve ability to stand for work.    Baseline  12 hours    Status  Achieved            Plan -  06/27/19 1234    Clinical Impression Statement  Patient doing well and has been able to return to work with no pain just discomfort at the end of a 12 hour shift. He has returned to the gym and is trying to get back to things gradually. Answered all questions about return to activities and discussed holding off on sprinting/running at this time. Discussed and educated  patient in alternative cardio workouts and strengthening exercises to work on. Patient verbalized understanding and is to discharge on this date secondary to content with progress made thus far.    Personal Factors and Comorbidities  Comorbidity 1    Comorbidities  hx of left foot bone fractures (unknown specifiic bones), chronic toe stiffness, low back pain    Examination-Activity Limitations  Squat;Stairs;Lift;Stand    Examination-Participation Restrictions  Yard Work;Cleaning    Stability/Clinical Decision Making  Stable/Uncomplicated    Rehab Potential  Excellent    PT Frequency  1x / week    PT Duration  6 weeks    PT Treatment/Interventions  ADLs/Self Care Home Management;Aquatic Therapy;Biofeedback;Cryotherapy;Electrical Stimulation;Iontophoresis 62m/ml Dexamethasone;Moist Heat;Traction;Gait training;Stair training;Functional mobility training;Therapeutic activities;Therapeutic exercise;Balance training;Neuromuscular re-education;Patient/family education;Manual techniques;Scar mobilization;Passive range of motion;Dry needling;Taping    PT Next Visit Plan  patient to discharge from physical therapy    PT Home Exercise Plan  eval: great toe mobility/stretches, calf stretch; 10/7 towel scrunch; 10/15 heel raises with over pressure    Consulted and Agree with Plan of Care  Patient       Patient will benefit from skilled therapeutic intervention in order to improve the following deficits and impairments:  Pain, Decreased mobility, Decreased strength, Increased edema, Decreased balance, Hypomobility, Difficulty walking, Decreased range of motion  Visit Diagnosis: Pain in left ankle and joints of left foot  Difficulty in walking, not elsewhere classified     Problem List There are no problems to display for this patient.  12:50 PM, 06/27/19 MJerene Pitch DPT [facility] [phone]  [place] [address]Phone: [phone]  Fax:  [phone] Name: JDESHAN HEMMELGARNMRN: [id number] of Birth: [birth year]

## 2019-07-04 ENCOUNTER — Encounter (HOSPITAL_COMMUNITY): Payer: Self-pay | Admitting: Physical Therapy

## 2019-07-11 ENCOUNTER — Encounter (HOSPITAL_COMMUNITY): Payer: Self-pay | Admitting: Physical Therapy

## 2019-07-18 ENCOUNTER — Encounter (HOSPITAL_COMMUNITY): Payer: Self-pay | Admitting: Physical Therapy
